# Patient Record
Sex: Male | Born: 1945 | Race: White | Hispanic: No | Marital: Married | Smoking: Former smoker
Health system: Southern US, Community
[De-identification: ages and names within clinical notes are randomized; demographics above are authoritative.]

## PROBLEM LIST (undated history)

## (undated) DIAGNOSIS — I10 Essential (primary) hypertension: Secondary | ICD-10-CM

## (undated) DIAGNOSIS — C61 Malignant neoplasm of prostate: Secondary | ICD-10-CM

## (undated) HISTORY — PX: CHOLECYSTECTOMY: SHX55

---

## 2009-12-27 ENCOUNTER — Encounter: Admission: RE | Admit: 2009-12-27 | Discharge: 2009-12-27 | Payer: Self-pay | Admitting: Family Medicine

## 2009-12-28 ENCOUNTER — Encounter: Admission: RE | Admit: 2009-12-28 | Discharge: 2009-12-28 | Payer: Self-pay | Admitting: Family Medicine

## 2011-09-04 IMAGING — CT CT ABD-PELV W/ CM
2 of 5 series · 17 of 46 positions shown, 19 images · IV contrast (READICAT/WATER & OMNI 300/[ID])
Comparison: None.

CLINICAL DATA: Left lower quadrant pain.

CT ABDOMEN AND PELVIS WITH CONTRAST
TECHNIQUE: Multidetector CT imaging of the abdomen and pelvis was
performed following the standard protocol during bolus
administration of intravenous contrast.
Contrast: 125 ml Vmnipaque-MSS

[Series 2: abdomen w/ · axial · 0.83mm/px · z∈[-432,-17]mm · 14 of 93 slices shown, 16 images]
[im 5/93  soft-tissue]
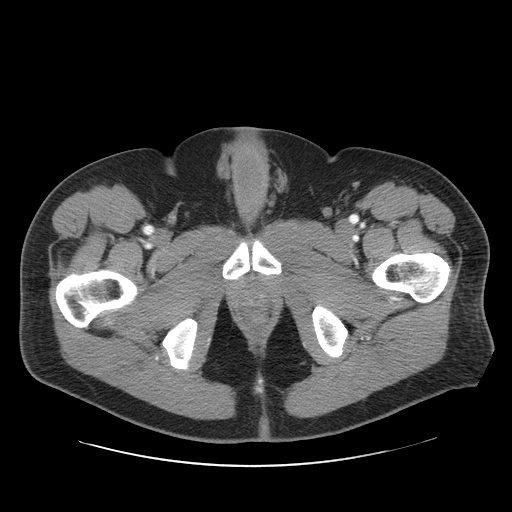
[im 5/93  bone]
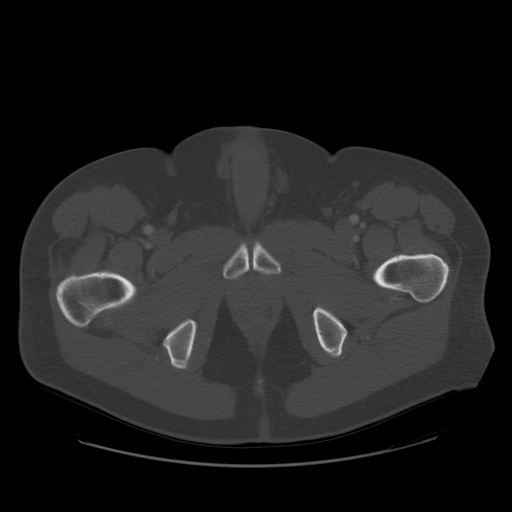
[im 10/93  soft-tissue]
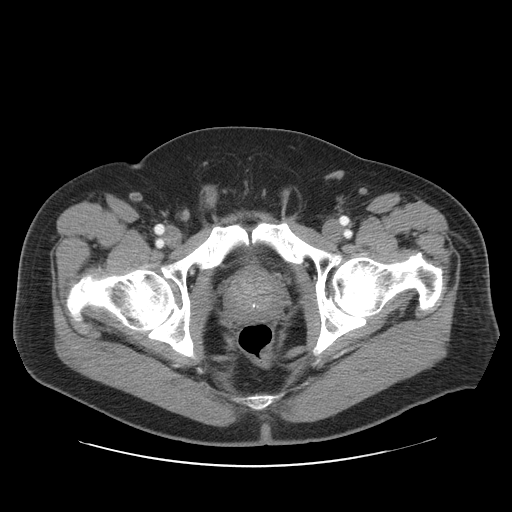
[im 20/93  soft-tissue]
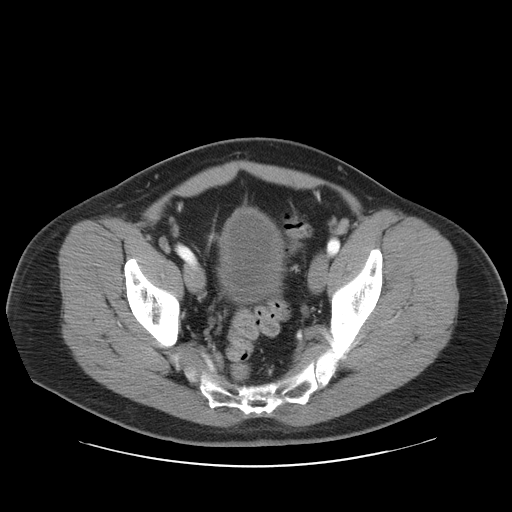
[im 25/93  soft-tissue]
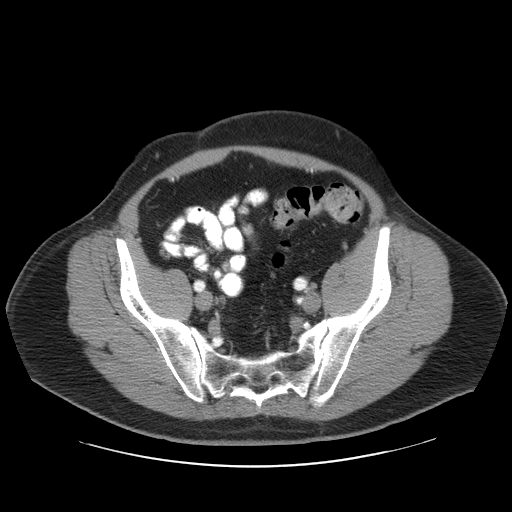
[im 30/93  soft-tissue]
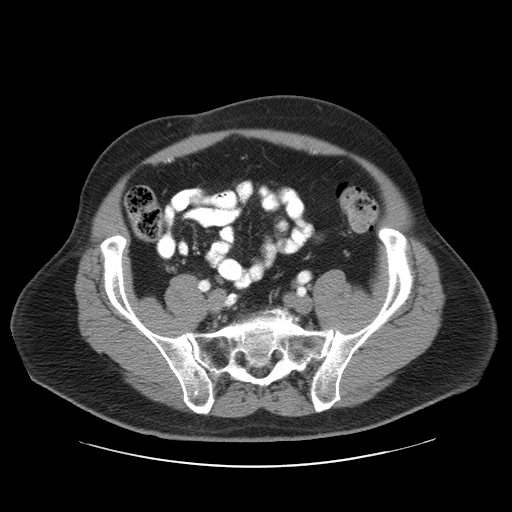
[im 39/93  soft-tissue]
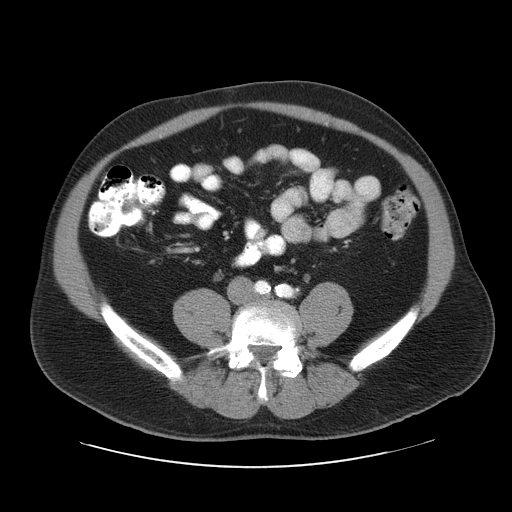
[im 44/93  soft-tissue]
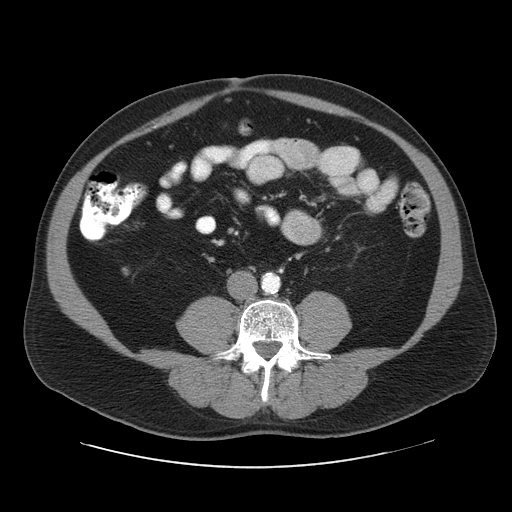
[im 49/93  soft-tissue]
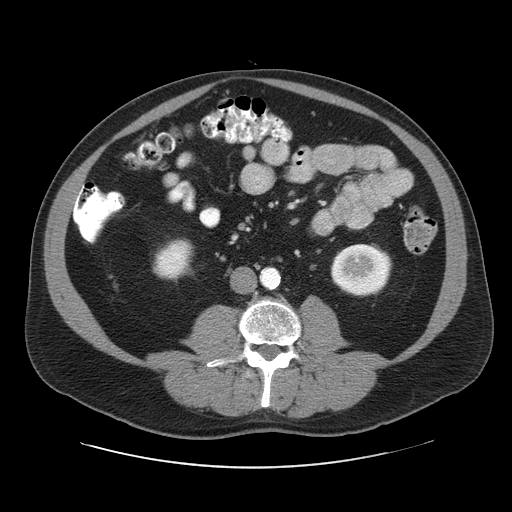
[im 54/93  soft-tissue]
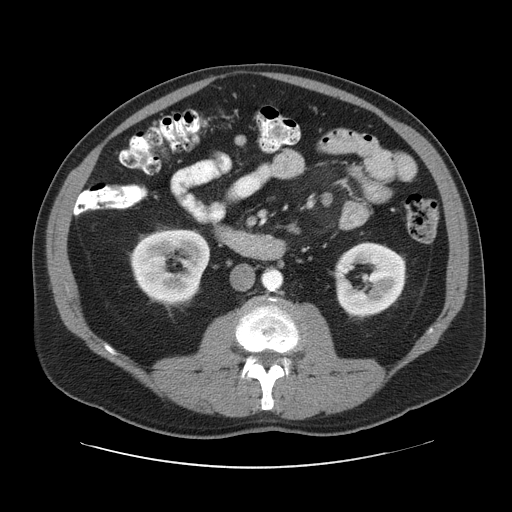
[im 54/93  bone]
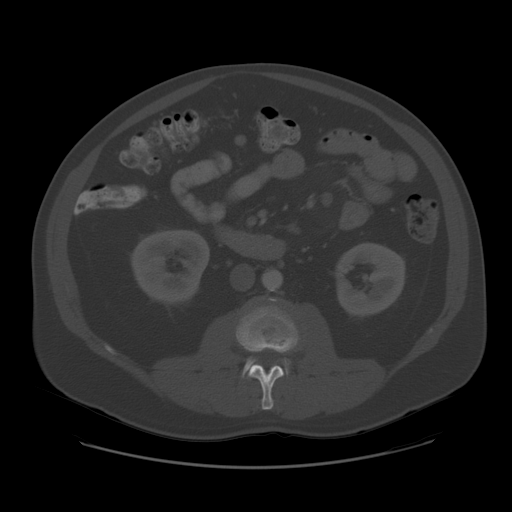
[im 63/93  soft-tissue]
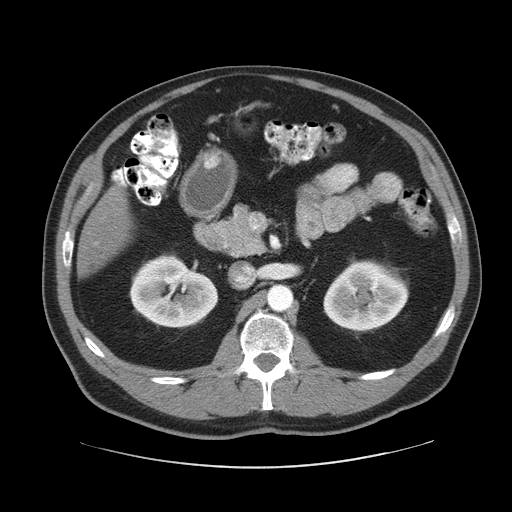
[im 68/93  soft-tissue]
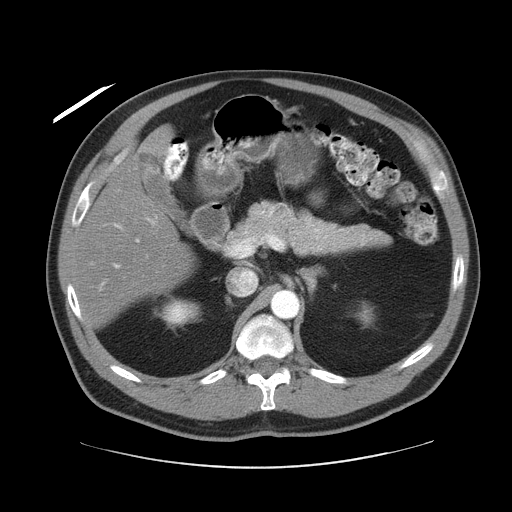
[im 73/93  soft-tissue]
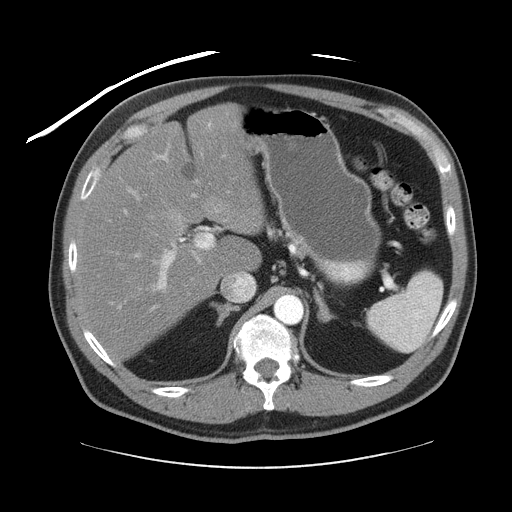
[im 83/93  soft-tissue]
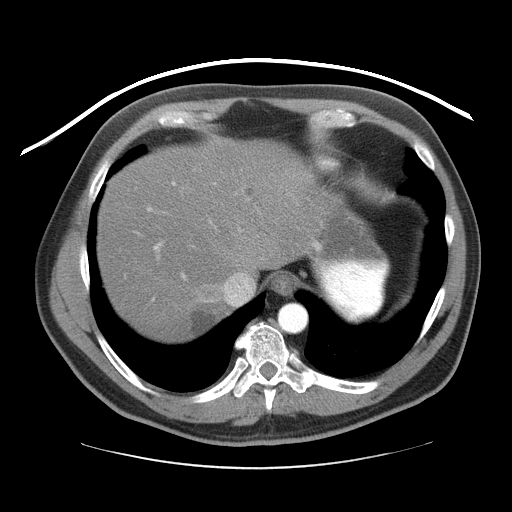
[im 88/93  soft-tissue]
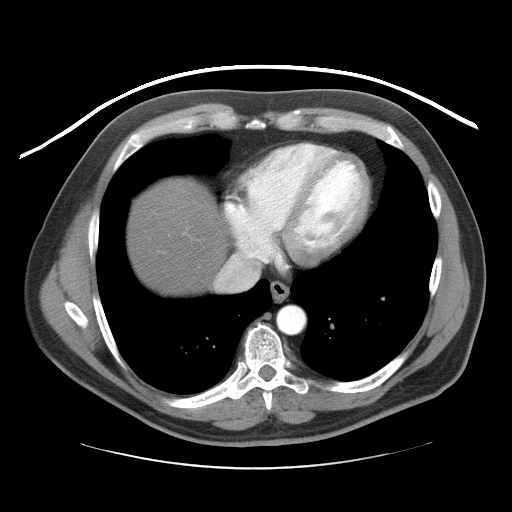

[Series 401: cor · coronal · 0.98mm/px · 3 of 138 slices shown]
[im 46/138  soft-tissue]
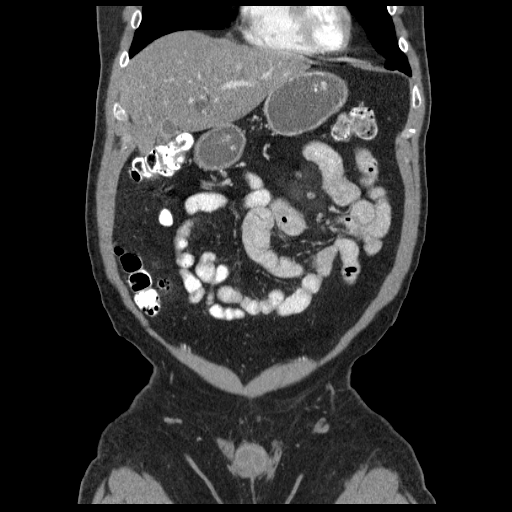
[im 61/138  soft-tissue]
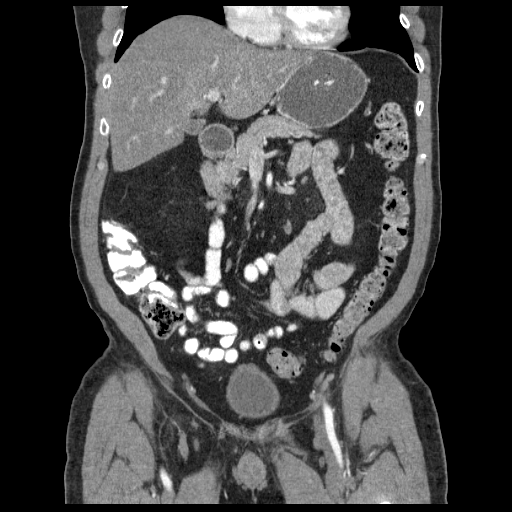
[im 77/138  soft-tissue]
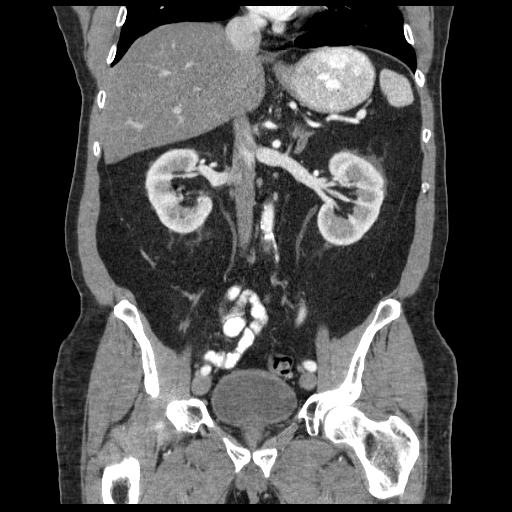

[17 of 46 positions shown; findings below may reference images not displayed]

FINDINGS: Lung bases are clear.  Heart size normal.  No pericardial
or pleural effusion.

Liver is diffusely decreased in attenuation.  Focal areas of even
greater decreased attenuation measure up to 2.3 cm in the dome of
the liver, and may represent areas of additional focal fat
deposition, or cysts.  The fundus of the gallbladder may have
somewhat of a retracted and knotted appearance.  However,
gallbladder is under distended, making further evaluation
difficult.

Right adrenal gland is unremarkable.  There is adreniform
thickening of the left adrenal gland.  Kidneys, spleen, pancreas,
stomach and bowel are unremarkable.  Prostate is enlarged and
indents the bladder base.  There is atherosclerotic calcification
of the arterial vasculature, without aneurysm.  No pathologically
enlarged lymph nodes.  Mild haziness and nodularity in the small
bowel mesentery are nonspecific.  No worrisome lytic or sclerotic
lesions.
IMPRESSION: 1.  Fatty liver.
2.  Gallbladder fundus appears somewhat knotted and retracted, and
a true lesion cannot be excluded.  The gallbladder is under
distended, however.  Right upper quadrant ultrasound after fasting
is recommended.  This was discussed with Markovich, in Dr. [place]
3.  Haziness and nodularity in the small bowel mesentery is
nonspecific.
4.  Enlarged prostate with indention of the bladder base.

## 2011-09-05 IMAGING — US US ABDOMEN LIMITED
1 series · 14 of 25 positions shown · non-contrast
Comparison: CT 12/27/2009

CLINICAL DATA: Abnormal gallbladder on CT.

LIMITED ABDOMINAL ULTRASOUND - RIGHT UPPER QUADRANT

[Series 1: us abdomen limited · 0.21mm/px · 14 of 31 slices shown]
[im 1/31]
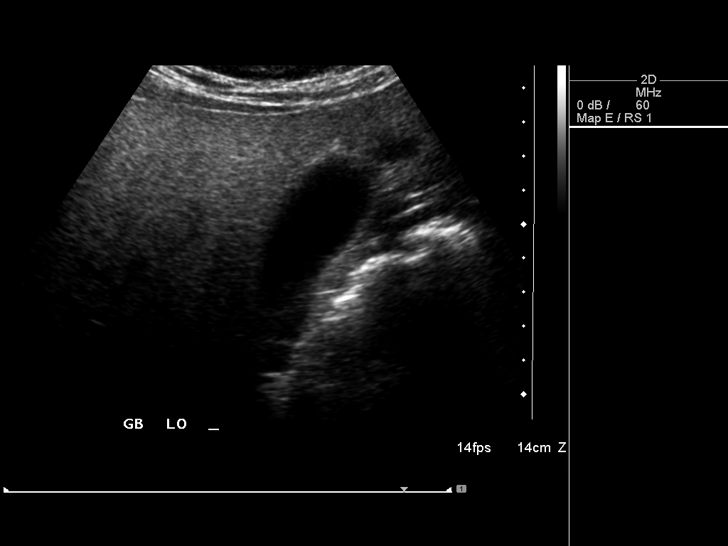
[im 3/31]
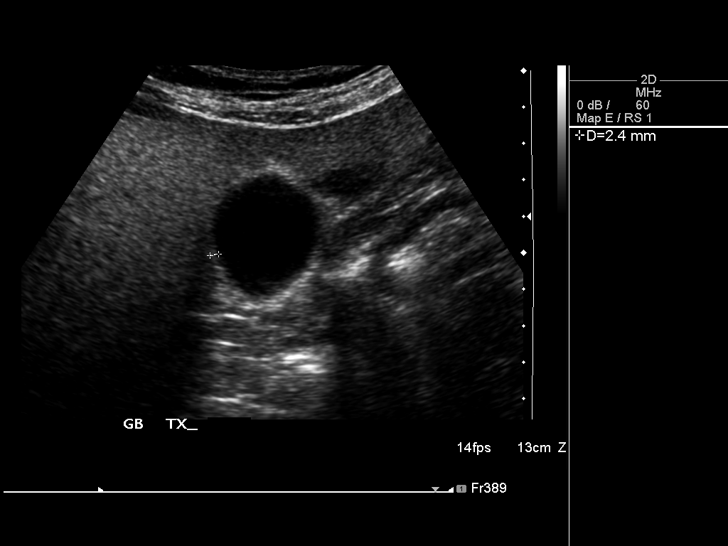
[im 6/31]
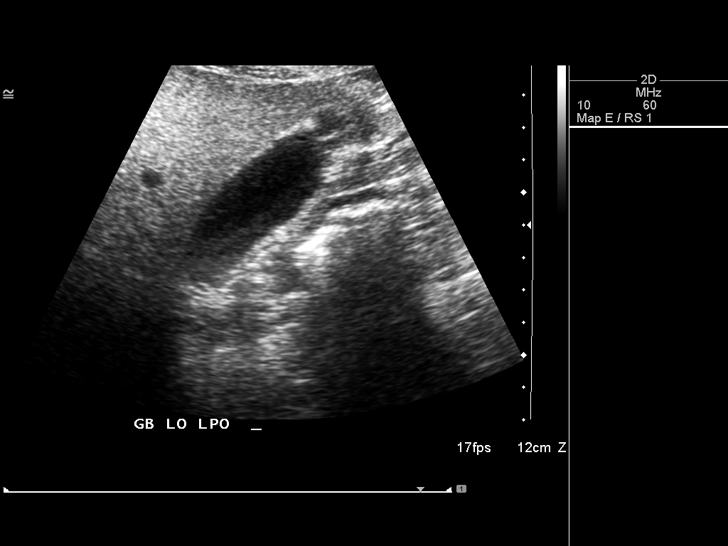
[im 8/31]
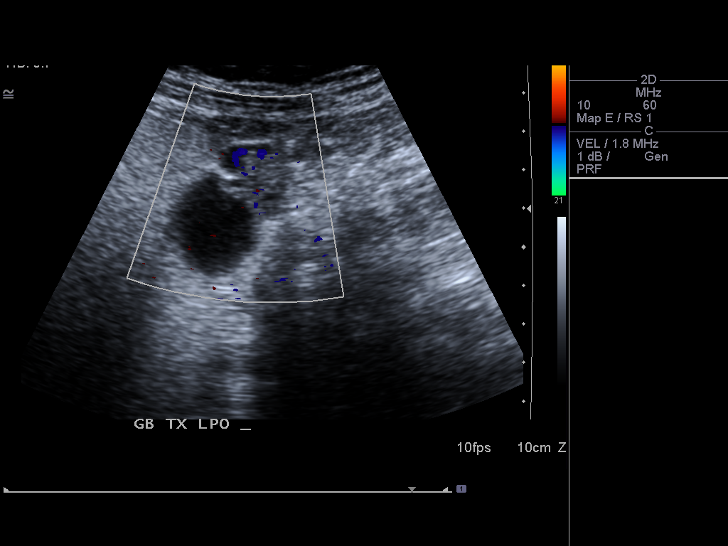
[im 11/31]
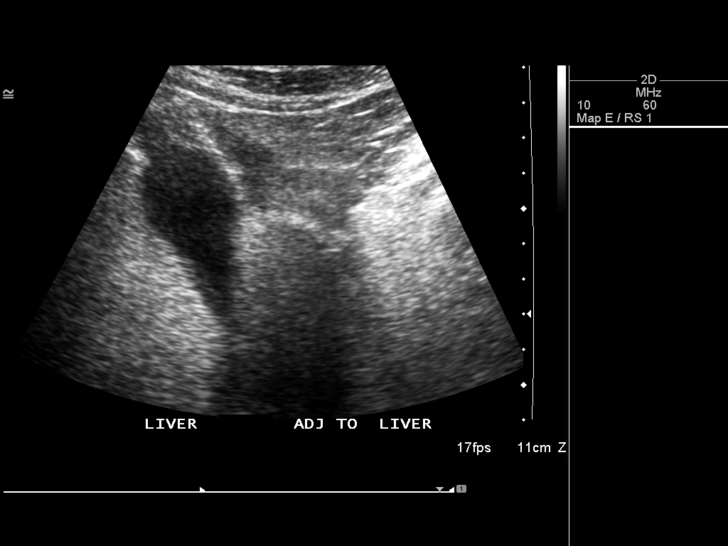
[im 12/31]
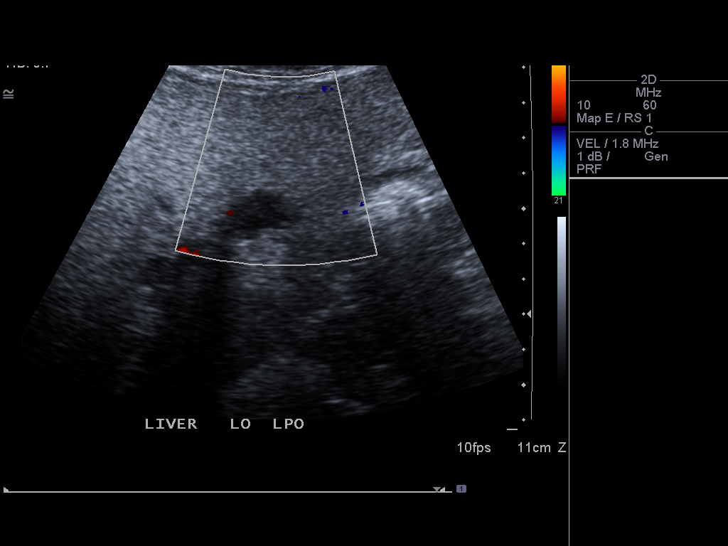
[im 14/31]
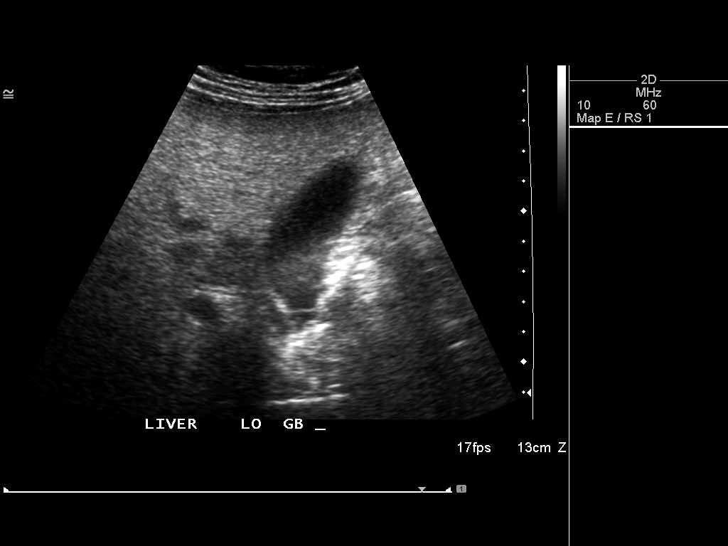
[im 17/31]
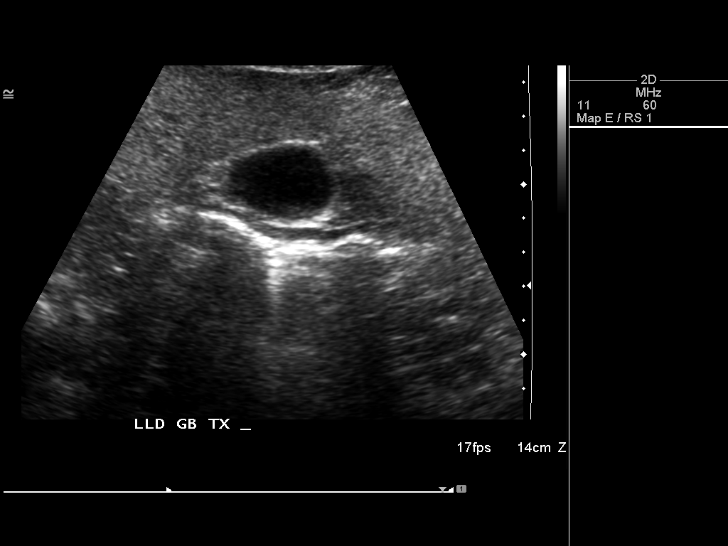
[im 19/31]
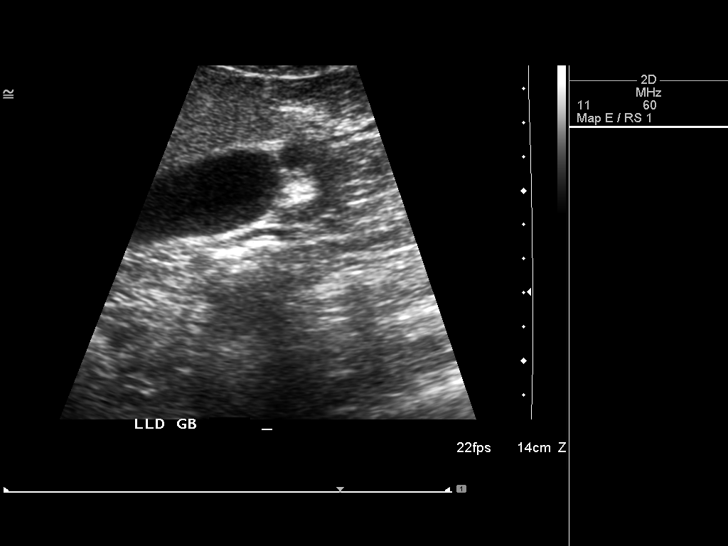
[im 21/31]
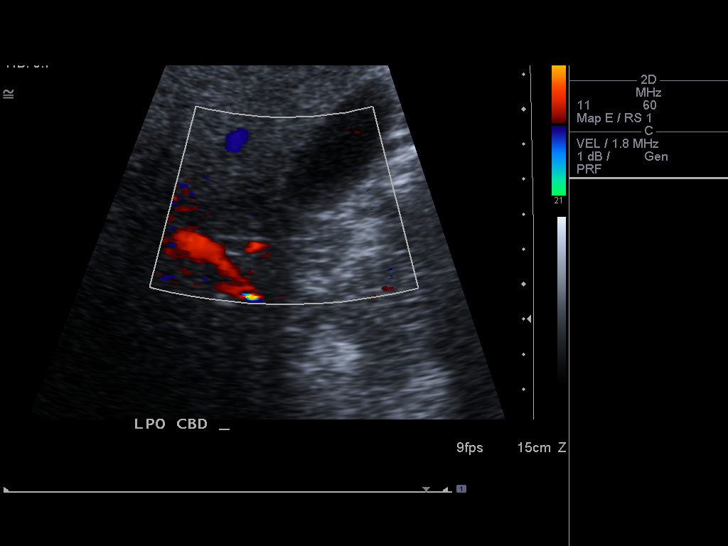
[im 23/31]
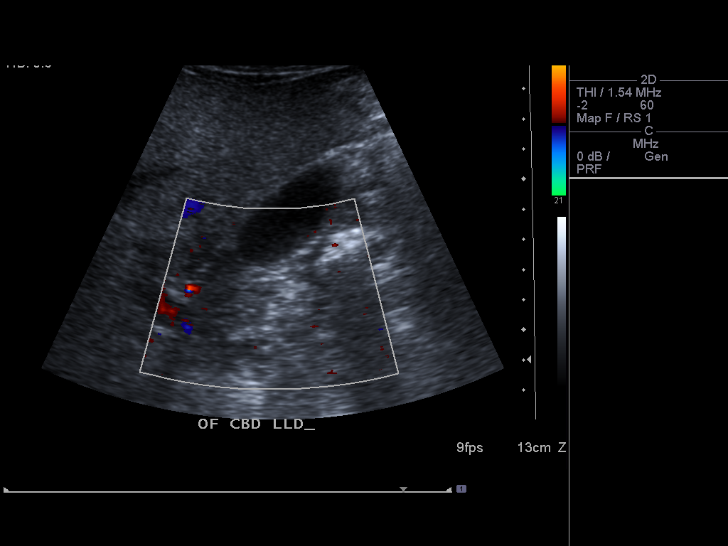
[im 26/31]
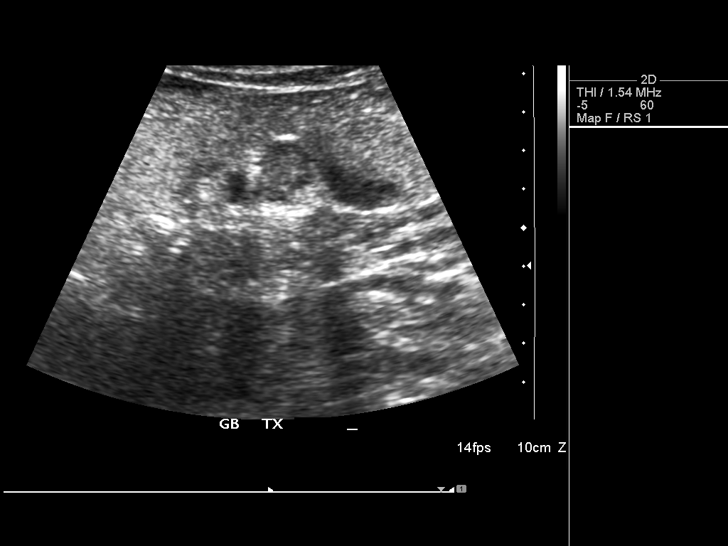
[im 28/31]
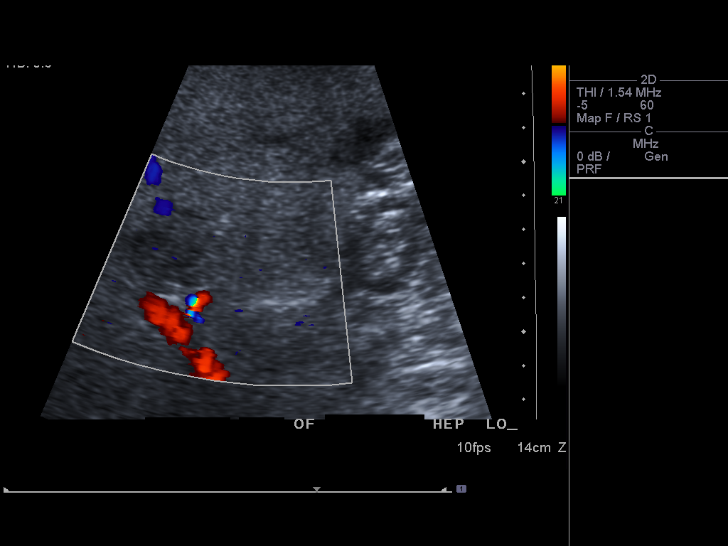
[im 31/31]
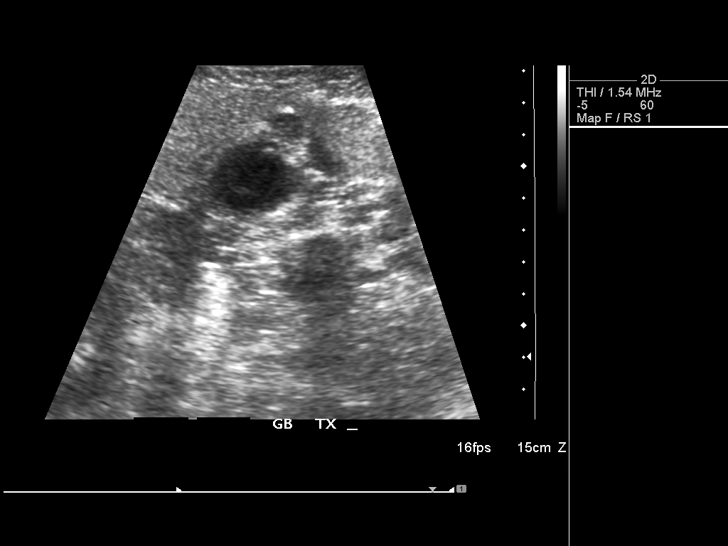

[14 of 25 positions shown; findings below may reference images not displayed]

FINDINGS: Gallbladder:  Small amount of sludge is noted in the gallbladder.
No stones or sonographic Murphy's sign.

There is focal wall thickening noted at the gallbladder fundus,
corresponding to abnormality seen on CT.  Hypoechoic soft tissue is
noted with in the fundus lumen.  A single comet-tail artifact is
noted in this region.

Common bile duct:  Normal, 3 mm.

Liver:  Increased heterogeneous echotexture throughout the liver
suggesting fatty infiltration.  There are areas of lower density,
likely focal fatty sparing.
IMPRESSION: Abnormal appearance of the gallbladder fundus as described above.
With the comet-tail artifact and the appearance on CT, this most
likely represents focal adenomyomatosis.  To completely exclude
early gallbladder neoplasm, follow-up ultrasound in 3-6 months is
recommended.

## 2016-06-25 ENCOUNTER — Emergency Department (HOSPITAL_COMMUNITY)
Admission: EM | Admit: 2016-06-25 | Discharge: 2016-06-25 | Disposition: A | Discharge status: Home / Self Care | Payer: Medicare Other | Attending: Emergency Medicine | Admitting: Emergency Medicine

## 2016-06-25 ENCOUNTER — Encounter (HOSPITAL_COMMUNITY): Payer: Self-pay | Admitting: Emergency Medicine

## 2016-06-25 DIAGNOSIS — Z87891 Personal history of nicotine dependence: Secondary | ICD-10-CM | POA: Insufficient documentation

## 2016-06-25 DIAGNOSIS — I10 Essential (primary) hypertension: Secondary | ICD-10-CM | POA: Diagnosis not present

## 2016-06-25 DIAGNOSIS — Z7982 Long term (current) use of aspirin: Secondary | ICD-10-CM | POA: Diagnosis not present

## 2016-06-25 DIAGNOSIS — Z79899 Other long term (current) drug therapy: Secondary | ICD-10-CM | POA: Insufficient documentation

## 2016-06-25 DIAGNOSIS — R55 Syncope and collapse: Secondary | ICD-10-CM | POA: Diagnosis present

## 2016-06-25 DIAGNOSIS — Z8546 Personal history of malignant neoplasm of prostate: Secondary | ICD-10-CM | POA: Diagnosis not present

## 2016-06-25 HISTORY — DX: Malignant neoplasm of prostate: C61

## 2016-06-25 HISTORY — DX: Essential (primary) hypertension: I10

## 2016-06-25 LAB — BASIC METABOLIC PANEL
Anion gap: 9 (ref 5–15)
BUN: 23 mg/dL — AB (ref 6–20)
CHLORIDE: 102 mmol/L (ref 101–111)
CO2: 24 mmol/L (ref 22–32)
Calcium: 9.3 mg/dL (ref 8.9–10.3)
Creatinine, Ser: 1.23 mg/dL (ref 0.61–1.24)
GFR calc Af Amer: >60 mL/min (ref >60)
GFR calc non Af Amer: 58 mL/min — ABNORMAL LOW (ref >60)
GLUCOSE: 111 mg/dL — AB (ref 65–99)
POTASSIUM: 3.6 mmol/L (ref 3.5–5.1)
SODIUM: 135 mmol/L (ref 135–145)

## 2016-06-25 LAB — I-STAT TROPONIN, ED: TROPONIN I, POC: 0 ng/mL (ref 0.00–0.08)

## 2016-06-25 LAB — CBG MONITORING, ED: Glucose-Capillary: 119 mg/dL — ABNORMAL HIGH (ref 65–99)

## 2016-06-25 LAB — CBC
HEMATOCRIT: 41.6 % (ref 39.0–52.0)
Hemoglobin: 14.4 g/dL (ref 13.0–17.0)
MCH: 30.4 pg (ref 26.0–34.0)
MCHC: 34.6 g/dL (ref 30.0–36.0)
MCV: 87.8 fL (ref 78.0–100.0)
Platelets: 150 10*3/uL (ref 150–400)
RBC: 4.74 MIL/uL (ref 4.22–5.81)
RDW: 13.9 % (ref 11.5–15.5)
WBC: 13 10*3/uL — AB (ref 4.0–10.5)

## 2016-06-25 NOTE — ED Triage Notes (Signed)
Pt brought in by EMS from a restaurant where he had a syncopal episode and was found on the floor. Per EMS he was pale and clammy upon arrival and He did have an episode of incontinence.

## 2016-06-25 NOTE — ED Triage Notes (Signed)
Pt states this is a recurrent issue for him and he sees a cardiologist regularly.

## 2016-06-25 NOTE — ED Notes (Signed)
Nurse will get blood from IV for istat troponin

## 2016-06-25 NOTE — ED Provider Notes (Signed)
[facility] DEPT Provider Note   CSN: [id number] Arrival date & time: 06/25/16  2015     History   Chief Complaint Chief Complaint  Patient presents with  . Loss of Consciousness    HPI Randy Cameron is a 70 y.o. male.   Loss of Consciousness   This is a new problem. The current episode started 1 to 2 hours ago. The problem occurs rarely. The problem has been resolved. He lost consciousness for a period of less than one minute. The problem is associated with standing up (dinner). Associated symptoms include bladder incontinence, diaphoresis, light-headedness and nausea. Pertinent negatives include abdominal pain, back pain, chest pain, dizziness, fever, focal weakness, headaches, palpitations, seizures and vomiting.    Past Medical History:  Diagnosis Date  . Hypertension   . Prostate cancer ([place])     There are no active problems to display for this patient.   Past Surgical History:  Procedure Laterality Date  . CHOLECYSTECTOMY         Home Medications    Prior to Admission medications   Medication Sig Start Date End Date Taking? Authorizing Provider  amLODipine (NORVASC) 5 MG tablet Take 5 mg by mouth daily.   Yes Historical Provider, MD  aspirin EC 81 MG tablet Take 81 mg by mouth daily.   Yes Historical Provider, MD  atenolol (TENORMIN) 25 MG tablet Take 25 mg by mouth daily.   Yes Historical Provider, MD  atorvastatin (LIPITOR) 20 MG tablet Take 20 mg by mouth daily.   Yes Historical Provider, MD  calcium citrate-vitamin D (CITRACAL+D) 315-200 MG-UNIT tablet Take 2 tablets by mouth daily.   Yes Historical Provider, MD  Cholecalciferol ([employer] D3) 2000 units CAPS Take 2,000 Units by mouth daily.   Yes Historical Provider, MD  finasteride (PROSCAR) 5 MG tablet Take 5 mg by mouth daily.   Yes Historical Provider, MD  hydrochlorothiazide (HYDRODIURIL) 25 MG tablet Take 25 mg by mouth daily.   Yes Historical Provider, MD  lisinopril (PRINIVIL,ZESTRIL) 40 MG tablet  Take 40 mg by mouth daily.   Yes Historical Provider, MD  meloxicam (MOBIC) 15 MG tablet Take 15 mg by mouth daily.   Yes Historical Provider, MD  Multiple Vitamin (MULTIVITAMIN WITH MINERALS) TABS tablet Take 1 tablet by mouth daily.   Yes Historical Provider, MD  niacin 500 MG tablet Take 1,000 mg by mouth at bedtime.   Yes Historical Provider, MD  Omega-3 Fatty Acids (EQL OMEGA 3 FISH OIL) 1400 MG CAPS Take 1,400 mg by mouth daily.   Yes Historical Provider, MD  omeprazole (PRILOSEC) 40 MG capsule Take 40 mg by mouth daily.   Yes Historical Provider, MD  tamsulosin (FLOMAX) 0.4 MG CAPS capsule Take 0.4 mg by mouth daily after breakfast.   Yes Historical Provider, MD  traMADol (ULTRAM) 50 MG tablet Take 50 mg by mouth every 6 (six) hours as needed.   Yes Historical Provider, MD  traZODone (DESYREL) 50 MG tablet Take 50 mg by mouth at bedtime.    Historical Provider, MD    Family History History reviewed. No pertinent family history.  Social History Social History  Substance Use Topics  . Smoking status: Former Research scientist (life sciences)  . Smokeless tobacco: Never Used  . Alcohol use No     Allergies   Patient has no known allergies.   Review of Systems Review of Systems  Constitutional: Positive for diaphoresis. Negative for chills and fever.  HENT: Negative for ear pain and sore throat.   Eyes:  Negative for pain and visual disturbance.  Respiratory: Negative for cough and shortness of breath.   Cardiovascular: Positive for syncope. Negative for chest pain and palpitations.  Gastrointestinal: Positive for nausea. Negative for abdominal pain and vomiting.  Genitourinary: Positive for bladder incontinence. Negative for dysuria and hematuria.  Musculoskeletal: Negative for arthralgias and back pain.  Skin: Negative for color change and rash.  Neurological: Positive for syncope and light-headedness. Negative for dizziness, focal weakness, seizures, facial asymmetry, speech difficulty and headaches.    All other systems reviewed and are negative.    Physical Exam Updated Vital Signs BP 114/83   Pulse 116   Temp 97.9 F (36.6 C) (Oral)   Resp 17   Ht 5\' 11"  (1.803 m)   Wt 85.3 kg   SpO2 92%   BMI 26.22 kg/m   Physical Exam  Constitutional: He is oriented to person, place, and time. He appears well-developed and well-nourished.  HENT:  Head: Normocephalic and atraumatic.  Eyes: Conjunctivae are normal.  Neck: Neck supple.  Cardiovascular: Normal rate and regular rhythm.   No murmur heard. Pulmonary/Chest: Effort normal and breath sounds normal. No respiratory distress.  Abdominal: Soft. There is no tenderness.  Musculoskeletal: He exhibits no edema.  Neurological: He is alert and oriented to person, place, and time. No cranial nerve deficit or sensory deficit. He exhibits normal muscle tone. Coordination normal.  No dysmetria, no dysdiadochocinesia, normal ambulation.   Skin: Skin is warm and dry.  Psychiatric: He has a normal mood and affect.  Nursing note and vitals reviewed.    ED Treatments / Results  Labs (all labs ordered are listed, but only abnormal results are displayed) Labs Reviewed  BASIC METABOLIC PANEL - Abnormal; Notable for the following:       Result Value   Glucose, Bld 111 (*)    BUN 23 (*)    GFR calc non Af Amer 58 (*)    All other components within normal limits  CBC - Abnormal; Notable for the following:    WBC 13.0 (*)    All other components within normal limits  CBG MONITORING, ED - Abnormal; Notable for the following:    Glucose-Capillary 119 (*)    All other components within normal limits  I-STAT TROPOININ, ED    EKG  EKG Interpretation  Date/Time:  Tuesday June 25 2016 20:25:27 EST Ventricular Rate:  73 PR Interval:    QRS Duration: 93 QT Interval:  376 QTC Calculation: 415 R Axis:   48 Text Interpretation:  Sinus rhythm Atrial premature complexes Otherwise within normal limits No old tracing to compare Confirmed by  [place]  MD, DAVID ([id number]) on 06/25/2016 8:33:07 PM       Radiology No results found.  Procedures Procedures (including critical care time)  Medications Ordered in ED Medications - No data to display   Initial Impression / Assessment and Plan / ED Course  I have reviewed the triage vital signs and the nursing notes.  Pertinent labs & imaging results that were available during my care of the patient were reviewed by me and considered in my medical decision making (see chart for details).  Clinical Course     This is a 70 year old male who reports after a syncopal episode via EMS. Initial glucoses in the 100s. He is alert and oriented 4. With no focal neurological deficits. He states he's back to his baseline. Says this happens to him from time to time. He gets flushing gets diaphoretic his family says  he is pale and he slowly syncopizes for a few moments. His vital signs are stable he's afebrile. He has no chest pain or shortness of breath. Lungs are clear EKG is unremarkable for any acute signs of ischemia does have some pre-mature atrial complexes. Chest x-ray is unremarkable for any acute cardiopulmonary pathology. Initial screening troponin is negative. Patient's history and comfort with this presentation he is safe for discharge home. He is advised to see his cardiologist for possible need for outpatient Holter monitor as we may be missing a arrhythmia. Patient agrees this plan stable for discharge home. Strict return precautions are given.  Final Clinical Impressions(s) / ED Diagnoses   Final diagnoses:  Syncope and collapse    New Prescriptions Discharge Medication List as of 06/25/2016 11:32 PM       Dewaine Conger, MD [id number]    Delora Fuel, MD [id number]
